# Patient Record
Sex: Female | Born: 1960 | Race: White | Hispanic: No | Marital: Married | Smoking: Never smoker
Health system: Southern US, Community
[De-identification: ages and names within clinical notes are randomized; demographics above are authoritative.]

## PROBLEM LIST (undated history)

## (undated) DIAGNOSIS — N200 Calculus of kidney: Secondary | ICD-10-CM

## (undated) DIAGNOSIS — E785 Hyperlipidemia, unspecified: Secondary | ICD-10-CM

## (undated) HISTORY — PX: COLONOSCOPY: SHX174

## (undated) HISTORY — DX: Calculus of kidney: N20.0

## (undated) HISTORY — DX: Hyperlipidemia, unspecified: E78.5

---

## 1988-09-03 HISTORY — PX: DILATION AND CURETTAGE OF UTERUS: SHX78

## 2005-09-12 ENCOUNTER — Ambulatory Visit: Payer: Self-pay | Admitting: Unknown Physician Specialty

## 2007-11-05 ENCOUNTER — Ambulatory Visit: Payer: Self-pay | Admitting: Unknown Physician Specialty

## 2008-11-09 ENCOUNTER — Ambulatory Visit: Payer: Self-pay | Admitting: Unknown Physician Specialty

## 2009-11-10 ENCOUNTER — Ambulatory Visit: Payer: Self-pay | Admitting: Unknown Physician Specialty

## 2012-03-03 LAB — HM PAP SMEAR: HM PAP: NEGATIVE

## 2014-04-08 ENCOUNTER — Ambulatory Visit: Payer: Self-pay | Admitting: Gastroenterology

## 2017-03-27 ENCOUNTER — Encounter: Payer: Self-pay | Admitting: Obstetrics and Gynecology

## 2017-03-29 ENCOUNTER — Ambulatory Visit (INDEPENDENT_AMBULATORY_CARE_PROVIDER_SITE_OTHER): Payer: BC Managed Care – PPO | Admitting: Obstetrics and Gynecology

## 2017-03-29 ENCOUNTER — Encounter: Payer: Self-pay | Admitting: Obstetrics and Gynecology

## 2017-03-29 VITALS — BP 124/72 | HR 78 | Ht 61.0 in | Wt 151.0 lb

## 2017-03-29 DIAGNOSIS — Z124 Encounter for screening for malignant neoplasm of cervix: Secondary | ICD-10-CM

## 2017-03-29 DIAGNOSIS — Z1231 Encounter for screening mammogram for malignant neoplasm of breast: Secondary | ICD-10-CM

## 2017-03-29 DIAGNOSIS — Z13220 Encounter for screening for lipoid disorders: Secondary | ICD-10-CM | POA: Diagnosis not present

## 2017-03-29 DIAGNOSIS — Z131 Encounter for screening for diabetes mellitus: Secondary | ICD-10-CM

## 2017-03-29 DIAGNOSIS — Z01419 Encounter for gynecological examination (general) (routine) without abnormal findings: Secondary | ICD-10-CM

## 2017-03-29 DIAGNOSIS — Z1329 Encounter for screening for other suspected endocrine disorder: Secondary | ICD-10-CM | POA: Diagnosis not present

## 2017-03-29 DIAGNOSIS — Z1321 Encounter for screening for nutritional disorder: Secondary | ICD-10-CM

## 2017-03-29 DIAGNOSIS — Z1239 Encounter for other screening for malignant neoplasm of breast: Secondary | ICD-10-CM

## 2017-03-29 NOTE — Progress Notes (Signed)
Patient ID: Tami Lopez, female   DOB: [birth year], 56 y.o.   MRN: [id number]     Gynecology Annual Exam  PCP: Patient, No Pcp Per  Chief Complaint:  Chief Complaint  Patient presents with  . Gynecologic Exam    routine blood work  . Bladder Prolapse    no urine leaking    History of Present Illness:Patient is a 56 y.o. [id number] presents for annual exam. The patient has no complaints today.   LMP: No LMP recorded. Patient is postmenopausal. No postmenopausal bleeding.  No significant vasomotor symptoms.  Feels prolapse more pronounced but not bothersome at present to warrant intervention.  The patient is sexually active. She denies dyspareunia.  The patient does perform self breast exams.  There is no notable family history of breast or ovarian cancer in her family.  The patient wears seatbelts: yes.   The patient has regular exercise: not asked.    The patient denies current symptoms of depression.     Review of Systems: Review of Systems  Constitutional: Negative for chills and fever.  HENT: Negative for congestion.   Respiratory: Negative for cough and shortness of breath.   Cardiovascular: Negative for chest pain and palpitations.  Gastrointestinal: Negative for abdominal pain, constipation, diarrhea, heartburn, nausea and vomiting.  Genitourinary: Negative for dysuria, frequency and urgency.  Skin: Negative for itching and rash.  Neurological: Negative for dizziness and headaches.  Endo/Heme/Allergies: Negative for polydipsia.  Psychiatric/Behavioral: Negative for depression.    Past Medical History:  Past Medical History:  Diagnosis Date  . Hyperlipidemia   . Kidney stones     Past Surgical History:  Past Surgical History:  Procedure Laterality Date  . COLONOSCOPY    . DILATION AND CURETTAGE OF UTERUS  1990   SAB    Gynecologic History:  No LMP recorded. Patient is postmenopausal. Last Pap: Results were: 03/03/12 NIL and HR HPV negative  Last mammogram:  09/19/15 Results were: BI-RAD I Obstetric History: [id number]: G4P0013  Family History:  Family History  Problem Relation Age of Onset  . Breast cancer Maternal Grandmother 7350    Social History:  Social History   Social History  . Marital status: Married    Spouse name: N/A  . Number of children: N/A  . Years of education: N/A   Occupational History  . Not on file.   Social History Main Topics  . Smoking status: Never Smoker  . Smokeless tobacco: Never Used  . Alcohol use No  . Drug use: No  . Sexual activity: Yes    Partners: Male    Birth control/ protection: None, Post-menopausal   Other Topics Concern  . Not on file   Social History Narrative  . No narrative on file    Allergies:  No Known Allergies  Medications: Prior to Admission medications   Not on File    Physical Exam Vitals: Blood pressure 124/72, pulse 78, height 5\' 1"  (1.549 m), weight 151 lb (68.5 kg).  General: NAD HEENT: normocephalic, anicteric Thyroid: no enlargement, no palpable nodules Pulmonary: No increased work of breathing, CTAB Cardiovascular: RRR, distal pulses 2+ Breast: Breast symmetrical, no tenderness, no palpable nodules or masses, no skin or nipple retraction present, no nipple discharge.  No axillary or supraclavicular lymphadenopathy. Abdomen: NABS, soft, non-tender, non-distended.  Umbilicus without lesions.  No hepatomegaly, splenomegaly or masses palpable. No evidence of hernia  Genitourinary:  External: Normal external female genitalia.  Normal urethral meatus, normal  Bartholin's and Skene's glands.  Vagina: Normal vaginal mucosa, moderate anterior wall prolapse and uterine prolapse present  Cervix: Grossly normal in appearance, no bleeding  Uterus: Non-enlarged, mobile, normal contour.  No CMT  Adnexa: ovaries non-enlarged, no adnexal masses  Rectal: deferred  Lymphatic: no evidence of inguinal lymphadenopathy Extremities: no edema, erythema, or tenderness Neurologic:  Grossly intact Psychiatric: mood appropriate, affect full  Female chaperone present for pelvic and breast  portions of the physical exam    Assessment: 56 y.o. G4P0013 routine annual  Plan: Problem List Items Addressed This Visit    None    Visit Diagnoses    Thyroid disorder screening    -  Primary   Relevant Orders   CMP14+LP+TP+TSH+CBC/Plt   Screening for malignant neoplasm of cervix       Relevant Orders   PapIG, HPV, rfx 16/18   Breast screening       Relevant Orders   MM DIGITAL SCREENING BILATERAL   Encounter for gynecological examination without abnormal finding       Relevant Orders   PapIG, HPV, rfx 16/18   CMP14+LP+TP+TSH+CBC/Plt   Screening for diabetes mellitus       Relevant Orders   CMP14+LP+TP+TSH+CBC/Plt   Screening cholesterol level       Relevant Orders   CMP14+LP+TP+TSH+CBC/Plt   Encounter for vitamin deficiency screening       Relevant Orders   25-Hydroxyvitamin D Lcms D2+D3      1) Mammogram - recommend yearly screening mammogram.  Mammogram Was ordered today - interested in thermography  2) STI screening was offered and declined  3) ASCCP guidelines and rational discussed.  Patient opts for every 5 years screening interval  4) Osteoporosis  - per USPTF routine screening DEXA at age 54  5) Routine healthcare maintenance including cholesterol, diabetes screening discussed managed by PCP  6) Colonoscopy Screening recommended starting at age 54 for average risk individuals, age 54 for individuals deemed at increased risk (including African Americans) and recommended to continue until age 54.  For patient age 476-85 individualized approach is recommended.  Gold standard screening is via colonoscopy, Cologuard screening is an acceptable alternative for patient unwilling or unable to undergo colonoscopy.  "Colorectal cancer screening for average?risk adults: 2018 guideline update from the American Cancer Society"CA: A Cancer Journal for Clinicians:  Jan 30, 2017  -Up to date  7) Follow up 1 year for routine annual

## 2017-03-29 NOTE — Patient Instructions (Signed)
Preventive Care 40-64 Years, Female Preventive care refers to lifestyle choices and visits with your health care provider that can promote health and wellness. What does preventive care include?  A yearly physical exam. This is also called an annual well check.  Dental exams once or twice a year.  Routine eye exams. Ask your health care provider how often you should have your eyes checked.  Personal lifestyle choices, including: ? Daily care of your teeth and gums. ? Regular physical activity. ? Eating a healthy diet. ? Avoiding tobacco and drug use. ? Limiting alcohol use. ? Practicing safe sex. ? Taking low-dose aspirin daily starting at age 58. ? Taking vitamin and mineral supplements as recommended by your health care provider. What happens during an annual well check? The services and screenings done by your health care provider during your annual well check will depend on your age, overall health, lifestyle risk factors, and family history of disease. Counseling Your health care provider may ask you questions about your:  Alcohol use.  Tobacco use.  Drug use.  Emotional well-being.  Home and relationship well-being.  Sexual activity.  Eating habits.  Work and work Statistician.  Method of birth control.  Menstrual cycle.  Pregnancy history.  Screening You may have the following tests or measurements:  Height, weight, and BMI.  Blood pressure.  Lipid and cholesterol levels. These may be checked every 5 years, or more frequently if you are over 81 years old.  Skin check.  Lung cancer screening. You may have this screening every year starting at age 78 if you have a 30-pack-year history of smoking and currently smoke or have quit within the past 15 years.  Fecal occult blood test (FOBT) of the stool. You may have this test every year starting at age 65.  Flexible sigmoidoscopy or colonoscopy. You may have a sigmoidoscopy every 5 years or a colonoscopy  every 10 years starting at age 30.  Hepatitis C blood test.  Hepatitis B blood test.  Sexually transmitted disease (STD) testing.  Diabetes screening. This is done by checking your blood sugar (glucose) after you have not eaten for a while (fasting). You may have this done every 1-3 years.  Mammogram. This may be done every 1-2 years. Talk to your health care provider about when you should start having regular mammograms. This may depend on whether you have a family history of breast cancer.  BRCA-related cancer screening. This may be done if you have a family history of breast, ovarian, tubal, or peritoneal cancers.  Pelvic exam and Pap test. This may be done every 3 years starting at age 80. Starting at age 36, this may be done every 5 years if you have a Pap test in combination with an HPV test.  Bone density scan. This is done to screen for osteoporosis. You may have this scan if you are at high risk for osteoporosis.  Discuss your test results, treatment options, and if necessary, the need for more tests with your health care provider. Vaccines Your health care provider may recommend certain vaccines, such as:  Influenza vaccine. This is recommended every year.  Tetanus, diphtheria, and acellular pertussis (Tdap, Td) vaccine. You may need a Td booster every 10 years.  Varicella vaccine. You may need this if you have not been vaccinated.  Zoster vaccine. You may need this after age 5.  Measles, mumps, and rubella (MMR) vaccine. You may need at least one dose of MMR if you were born in  1957 or later. You may also need a second dose.  Pneumococcal 13-valent conjugate (PCV13) vaccine. You may need this if you have certain conditions and were not previously vaccinated.  Pneumococcal polysaccharide (PPSV23) vaccine. You may need one or two doses if you smoke cigarettes or if you have certain conditions.  Meningococcal vaccine. You may need this if you have certain  conditions.  Hepatitis A vaccine. You may need this if you have certain conditions or if you travel or work in places where you may be exposed to hepatitis A.  Hepatitis B vaccine. You may need this if you have certain conditions or if you travel or work in places where you may be exposed to hepatitis B.  Haemophilus influenzae type b (Hib) vaccine. You may need this if you have certain conditions.  Talk to your health care provider about which screenings and vaccines you need and how often you need them. This information is not intended to replace advice given to you by your health care provider. Make sure you discuss any questions you have with your health care provider. Document Released: 09/16/2015 Document Revised: 05/09/2016 Document Reviewed: 06/21/2015 Elsevier Interactive Patient Education  2017 [employer].

## 2017-04-02 ENCOUNTER — Encounter: Payer: Self-pay | Admitting: Obstetrics and Gynecology

## 2017-04-02 LAB — PAPIG, HPV, RFX 16/18
HPV, high-risk: NEGATIVE
PAP SMEAR COMMENT: 0

## 2017-04-06 LAB — CMP14+LP+TP+TSH+CBC/PLT
ALBUMIN: 4.6 g/dL (ref 3.5–5.5)
ALK PHOS: 120 IU/L — AB (ref 39–117)
ALT: 13 IU/L (ref 0–32)
AST: 21 IU/L (ref 0–40)
Albumin/Globulin Ratio: 1.6 (ref 1.2–2.2)
BUN / CREAT RATIO: 23 (ref 9–23)
BUN: 18 mg/dL (ref 6–24)
Bilirubin Total: 0.4 mg/dL (ref 0.0–1.2)
CALCIUM: 9.8 mg/dL (ref 8.7–10.2)
CHLORIDE: 101 mmol/L (ref 96–106)
CHOLESTEROL TOTAL: 206 mg/dL — AB (ref 100–199)
CO2: 25 mmol/L (ref 20–29)
CREATININE: 0.8 mg/dL (ref 0.57–1.00)
Free Thyroxine Index: 1.8 (ref 1.2–4.9)
GFR calc non Af Amer: 84 mL/min/{1.73_m2} (ref >59)
GFR, EST AFRICAN AMERICAN: 97 mL/min/{1.73_m2} (ref >59)
GLOBULIN, TOTAL: 2.8 g/dL (ref 1.5–4.5)
GLUCOSE: 85 mg/dL (ref 65–99)
HDL: 53 mg/dL (ref >39)
HEMATOCRIT: 42.7 % (ref 34.0–46.6)
Hemoglobin: 14.3 g/dL (ref 11.1–15.9)
LDL CALC: 119 mg/dL — AB (ref 0–99)
LDl/HDL Ratio: 2.2 ratio (ref 0.0–3.2)
MCH: 29.8 pg (ref 26.6–33.0)
MCHC: 33.5 g/dL (ref 31.5–35.7)
MCV: 89 fL (ref 79–97)
PLATELETS: 237 10*3/uL (ref 150–379)
Potassium: 4.6 mmol/L (ref 3.5–5.2)
RBC: 4.8 x10E6/uL (ref 3.77–5.28)
RDW: 14 % (ref 12.3–15.4)
SODIUM: 140 mmol/L (ref 134–144)
T3 Uptake Ratio: 22 % — ABNORMAL LOW (ref 24–39)
T4 TOTAL: 8.1 ug/dL (ref 4.5–12.0)
TOTAL PROTEIN: 7.4 g/dL (ref 6.0–8.5)
TRIGLYCERIDES: 170 mg/dL — AB (ref 0–149)
TSH: 2.12 u[IU]/mL (ref 0.450–4.500)
VLDL CHOLESTEROL CAL: 34 mg/dL (ref 5–40)
WBC: 4.7 10*3/uL (ref 3.4–10.8)

## 2017-04-06 LAB — 25-HYDROXY VITAMIN D LCMS D2+D3
25-Hydroxy, Vitamin D-2: <1 ng/mL
25-Hydroxy, Vitamin D-3: 34 ng/mL
25-Hydroxy, Vitamin D: 34 ng/mL

## 2017-04-11 ENCOUNTER — Encounter: Payer: Self-pay | Admitting: Obstetrics and Gynecology

## 2019-11-05 ENCOUNTER — Encounter: Payer: Self-pay | Admitting: Gastroenterology

## 2020-01-08 ENCOUNTER — Ambulatory Visit: Payer: Self-pay | Attending: Internal Medicine

## 2020-01-08 DIAGNOSIS — Z23 Encounter for immunization: Secondary | ICD-10-CM

## 2020-01-08 NOTE — Progress Notes (Signed)
   Covid-19 Vaccination Clinic  Name:  TAYSHA MAJEWSKI    MRN: [id number] DOB: October 03, 1960  01/08/2020  Ms. Troutman was observed post Covid-19 immunization for 15 minutes without incident. She was provided with Vaccine Information Sheet and instruction to access the V-Safe system.   Ms. Derstine was instructed to call 911 with any severe reactions post vaccine: Marland Kitchen Difficulty breathing  . Swelling of face and throat  . A fast heartbeat  . A bad rash all over body  . Dizziness and weakness   Immunizations Administered    Name Date Dose VIS Date Route   Pfizer COVID-19 Vaccine 01/08/2020  8:51 AM 0.3 mL 10/28/2018 Intramuscular   Manufacturer: [employer], [employer]   Lot: [id number]   NDC: [address]-5

## 2020-02-02 ENCOUNTER — Ambulatory Visit: Payer: Self-pay | Attending: Internal Medicine

## 2020-02-02 DIAGNOSIS — Z23 Encounter for immunization: Secondary | ICD-10-CM

## 2020-02-02 NOTE — Progress Notes (Signed)
° °  Covid-19 Vaccination Clinic  Name:  Tami Lopez    MRN: [id number] DOB: [birth year]  02/02/2020  [name] was observed post Covid-19 immunization for 15 minutes without incident. She was provided with Vaccine Information Sheet and instruction to access the V-Safe system.   [name] was instructed to call 911 with any severe reactions post vaccine:  Difficulty breathing   Swelling of face and throat   A fast heartbeat   A bad rash all over body   Dizziness and weakness   Immunizations Administered    Name Date Dose VIS Date Route   Pfizer COVID-19 Vaccine 02/02/2020  8:23 AM 0.3 mL 10/28/2018 Intramuscular   Manufacturer: [employer], [employer]   Lot: [id number]   NDC: [address]-8

## 2020-03-16 ENCOUNTER — Other Ambulatory Visit: Payer: Self-pay | Admitting: Family Medicine

## 2020-03-16 DIAGNOSIS — Z1231 Encounter for screening mammogram for malignant neoplasm of breast: Secondary | ICD-10-CM

## 2020-04-07 ENCOUNTER — Ambulatory Visit
Admission: RE | Admit: 2020-04-07 | Discharge: 2020-04-07 | Disposition: A | Discharge status: Home / Self Care | Payer: BC Managed Care – PPO | Source: Ambulatory Visit | Attending: Family Medicine | Admitting: Family Medicine

## 2020-04-07 ENCOUNTER — Other Ambulatory Visit: Payer: Self-pay

## 2020-04-07 DIAGNOSIS — Z1231 Encounter for screening mammogram for malignant neoplasm of breast: Secondary | ICD-10-CM | POA: Diagnosis not present

## 2020-06-10 ENCOUNTER — Other Ambulatory Visit (HOSPITAL_COMMUNITY)
Admission: RE | Admit: 2020-06-10 | Discharge: 2020-06-10 | Disposition: A | Discharge status: Home / Self Care | Payer: BC Managed Care – PPO | Source: Ambulatory Visit | Attending: Obstetrics and Gynecology | Admitting: Obstetrics and Gynecology

## 2020-06-10 ENCOUNTER — Ambulatory Visit (INDEPENDENT_AMBULATORY_CARE_PROVIDER_SITE_OTHER): Payer: BC Managed Care – PPO | Admitting: Obstetrics and Gynecology

## 2020-06-10 ENCOUNTER — Other Ambulatory Visit: Payer: Self-pay

## 2020-06-10 ENCOUNTER — Encounter: Payer: Self-pay | Admitting: Obstetrics and Gynecology

## 2020-06-10 VITALS — BP 122/88 | HR 76 | Ht 61.0 in | Wt 163.0 lb

## 2020-06-10 DIAGNOSIS — Z01419 Encounter for gynecological examination (general) (routine) without abnormal findings: Secondary | ICD-10-CM

## 2020-06-10 DIAGNOSIS — Z1239 Encounter for other screening for malignant neoplasm of breast: Secondary | ICD-10-CM

## 2020-06-10 DIAGNOSIS — Z124 Encounter for screening for malignant neoplasm of cervix: Secondary | ICD-10-CM

## 2020-06-10 DIAGNOSIS — N814 Uterovaginal prolapse, unspecified: Secondary | ICD-10-CM

## 2020-06-10 NOTE — Progress Notes (Signed)
Gynecology Annual Exam  PCP: Marisue Ivan, MD  Chief Complaint:  Chief Complaint  Patient presents with  . Gynecologic Exam    prolapse bladder    History of Present Illness:Patient is a 59 y.o. [id number] presents for annual exam. The patient has no complaints today.   LMP: No LMP recorded. Patient is postmenopausal.  The patient is sexually active. She denies dyspareunia.  The patient does perform self breast exams.  There is no notable family history of breast or ovarian cancer in her family.  The patient wears seatbelts: yes.   The patient has regular exercise: not asked.    The patient denies current symptoms of depression.     Review of Systems: Review of Systems  Constitutional: Negative for chills and fever.  HENT: Negative for congestion.   Respiratory: Negative for cough and shortness of breath.   Cardiovascular: Negative for chest pain and palpitations.  Gastrointestinal: Negative for abdominal pain, constipation, diarrhea, heartburn, nausea and vomiting.  Genitourinary: Negative for dysuria, frequency and urgency.  Skin: Negative for itching and rash.  Neurological: Negative for dizziness and headaches.  Endo/Heme/Allergies: Negative for polydipsia.  Psychiatric/Behavioral: Negative for depression.    Past Medical History:  There are no problems to display for this patient.   Past Surgical History:  Past Surgical History:  Procedure Laterality Date  . COLONOSCOPY    . DILATION AND CURETTAGE OF UTERUS  1990   SAB    Gynecologic History:  No LMP recorded. Patient is postmenopausal. Last Pap: Results were: 03/29/2017 NIL and HR HPV negative  Last mammogram: 04/07/2020 Results were: BI-RAD I  Obstetric History: [id number]  Family History:  Family History  Problem Relation Age of Onset  . Breast cancer Maternal Grandmother 3    Social History:  Social History   Socioeconomic History  . Marital status: Married    Spouse name: Not on file  .  Number of children: Not on file  . Years of education: Not on file  . Highest education level: Not on file  Occupational History  . Not on file  Tobacco Use  . Smoking status: Never Smoker  . Smokeless tobacco: Never Used  Vaping Use  . Vaping Use: Never used  Substance and Sexual Activity  . Alcohol use: No  . Drug use: No  . Sexual activity: Yes    Partners: Male    Birth control/protection: None, Post-menopausal  Other Topics Concern  . Not on file  Social History Narrative  . Not on file   Social Determinants of Health   Financial Resource Strain:   . Difficulty of Paying Living Expenses: Not on file  Food Insecurity:   . Worried About Programme researcher, broadcasting/film/video in the Last Year: Not on file  . Ran Out of Food in the Last Year: Not on file  Transportation Needs:   . Lack of Transportation (Medical): Not on file  . Lack of Transportation (Non-Medical): Not on file  Physical Activity:   . Days of Exercise per Week: Not on file  . Minutes of Exercise per Session: Not on file  Stress:   . Feeling of Stress : Not on file  Social Connections:   . Frequency of Communication with Friends and Family: Not on file  . Frequency of Social Gatherings with Friends and Family: Not on file  . Attends Religious Services: Not on file  . Active Member of Clubs or Organizations: Not on file  . Attends Banker  Meetings: Not on file  . Marital Status: Not on file  Intimate Partner Violence:   . Fear of Current or Ex-Partner: Not on file  . Emotionally Abused: Not on file  . Physically Abused: Not on file  . Sexually Abused: Not on file    Allergies:  No Known Allergies  Medications: Prior to Admission medications   Not on File    Physical Exam Vitals: Blood pressure 122/88, pulse 76, height 5\' 1"  (1.549 m), weight 163 lb (73.9 kg).   General: NAD HEENT: normocephalic, anicteric Thyroid: no enlargement, no palpable nodules Pulmonary: No increased work of breathing,  CTAB Cardiovascular: RRR, distal pulses 2+ Abdomen: NABS, soft, non-tender, non-distended.  Umbilicus without lesions.  No hepatomegaly, splenomegaly or masses palpable. No evidence of hernia  Genitourinary:  External: Normal external female genitalia.  Normal urethral meatus, normal Bartholin's and Skene's glands.    Vagina: Normal vaginal mucosa, there is a grade II cystocele that come down to the level of the introitus with straining, as well as some apical prolapse.  Good posterior wall support.   Cervix: Grossly normal in appearance, no bleeding  Uterus: Non-enlarged, mobile, normal contour.  No CMT  Adnexa: ovaries non-enlarged, no adnexal masses  Rectal: deferred  Lymphatic: no evidence of inguinal lymphadenopathy Extremities: no edema, erythema, or tenderness Neurologic: Grossly intact Psychiatric: mood appropriate, affect full  Female chaperone present for pelvic and breast  portions of the physical exam  Immunization History  Administered Date(s) Administered  . PFIZER SARS-COV-2 Vaccination 01/08/2020, 02/02/2020    Assessment: 59 y.o. G4P0013 routine annual exam  Plan: Problem List Items Addressed This Visit    None    Visit Diagnoses    Encounter for gynecological examination without abnormal finding    -  Primary   Screening for malignant neoplasm of cervix       Relevant Orders   Cytology - PAP   Breast screening          1) Mammogram - recommend yearly screening mammogram.  Mammogram Is up to date  2) STI screening  was notoffered and therefore not obtained  3) ASCCP guidelines and rational discussed.  Patient opts for every 3 years screening interval.  May be interested to spacing out to 5 year next year  4) Osteoporosis  - per USPTF routine screening DEXA at age 24  5) Routine healthcare maintenance including cholesterol, diabetes screening discussed managed by PCP  6) Colonoscopy colonoscopy 2015  7) Return in about 1 year (around 06/10/2021) for  annual.    08/10/2021, MD Vena Austria, [facility] 06/10/2020, 10:16 AM

## 2020-06-13 LAB — CYTOLOGY - PAP
Comment: NEGATIVE
Diagnosis: NEGATIVE
High risk HPV: NEGATIVE

## 2020-09-17 ENCOUNTER — Other Ambulatory Visit: Payer: Self-pay

## 2020-09-17 DIAGNOSIS — Z20822 Contact with and (suspected) exposure to covid-19: Secondary | ICD-10-CM

## 2020-09-20 LAB — NOVEL CORONAVIRUS, NAA: SARS-CoV-2, NAA: DETECTED — AB

## 2021-06-02 ENCOUNTER — Telehealth: Payer: Self-pay | Admitting: Obstetrics and Gynecology

## 2021-06-02 NOTE — Telephone Encounter (Signed)
Contacted patient to reschedule the appointment on 10/12 with AMS.  Left a message.

## 2021-06-14 ENCOUNTER — Ambulatory Visit: Payer: BC Managed Care – PPO | Admitting: Obstetrics and Gynecology

## 2021-06-23 ENCOUNTER — Ambulatory Visit (INDEPENDENT_AMBULATORY_CARE_PROVIDER_SITE_OTHER): Payer: BC Managed Care – PPO | Admitting: Obstetrics and Gynecology

## 2021-06-23 ENCOUNTER — Encounter: Payer: Self-pay | Admitting: Obstetrics and Gynecology

## 2021-06-23 ENCOUNTER — Other Ambulatory Visit: Payer: Self-pay

## 2021-06-23 VITALS — BP 100/68 | HR 73 | Ht 61.0 in | Wt 162.0 lb

## 2021-06-23 DIAGNOSIS — Z1239 Encounter for other screening for malignant neoplasm of breast: Secondary | ICD-10-CM

## 2021-06-23 DIAGNOSIS — Z01419 Encounter for gynecological examination (general) (routine) without abnormal findings: Secondary | ICD-10-CM

## 2021-06-23 NOTE — Patient Instructions (Signed)
[facility] [address] [place] [address]  [facility] [place]  [address] [place] [address]  Phone: [phone]  

## 2021-06-23 NOTE — Progress Notes (Signed)
Gynecology Annual Exam  PCP: Marisue Ivan, MD  Chief Complaint:  Chief Complaint  Patient presents with   Gynecologic Exam    Annual -no concerns. RM 5    History of Present Illness:Patient is a 59 y.o. [id number] presents for annual exam. The patient has no complaints today.   LMP: No LMP recorded. Patient is postmenopausal. No PMB  The patient is sexually active. She denies dyspareunia.  The patient does perform self breast exams.  There is no notable family history of breast or ovarian cancer in her family.  The patient wears seatbelts: yes.   The patient has regular exercise: not asked.    The patient denies current symptoms of depression.     Review of Systems: Review of Systems  Constitutional:  Negative for chills and fever.  HENT:  Negative for congestion.   Respiratory:  Negative for cough and shortness of breath.   Cardiovascular:  Negative for chest pain and palpitations.  Gastrointestinal:  Negative for abdominal pain, constipation, diarrhea, heartburn, nausea and vomiting.  Genitourinary:  Negative for dysuria, frequency and urgency.  Skin:  Negative for itching and rash.  Neurological:  Negative for dizziness and headaches.  Endo/Heme/Allergies:  Negative for polydipsia.  Psychiatric/Behavioral:  Negative for depression.    Past Medical History:  Patient Active Problem List   Diagnosis Date Noted   Cystocele with uterine descensus 06/10/2020    Past Surgical History:  Past Surgical History:  Procedure Laterality Date   COLONOSCOPY     DILATION AND CURETTAGE OF UTERUS  1990   SAB    Gynecologic History:  No LMP recorded. Patient is postmenopausal. Last Pap: Results were: 10/08/2021NIL and HR HPV negative  Last mammogram: 04/07/2020 Results were: BI-RAD I  Obstetric History: E3P2951  Family History:  Family History  Problem Relation Age of Onset   Breast cancer Maternal Grandmother 13    Social History:  Social History    Socioeconomic History   Marital status: Married    Spouse name: Not on file   Number of children: Not on file   Years of education: Not on file   Highest education level: Not on file  Occupational History   Not on file  Tobacco Use   Smoking status: Never   Smokeless tobacco: Never  Vaping Use   Vaping Use: Never used  Substance and Sexual Activity   Alcohol use: No   Drug use: No   Sexual activity: Yes    Partners: Male    Birth control/protection: None, Post-menopausal  Other Topics Concern   Not on file  Social History Narrative   Not on file   Social Determinants of Health   Financial Resource Strain: Not on file  Food Insecurity: Not on file  Transportation Needs: Not on file  Physical Activity: Not on file  Stress: Not on file  Social Connections: Not on file  Intimate Partner Violence: Not on file    Allergies:  No Known Allergies  Medications: Prior to Admission medications   Not on File    Physical Exam Vitals: Blood pressure 100/68, pulse 73, height 5\' 1"  (1.549 m), weight 162 lb (73.5 kg). Body mass index is 30.61 kg/m.  General: NAD HEENT: normocephalic, anicteric Thyroid: no enlargement, no palpable nodules Pulmonary: No increased work of breathing, CTAB Cardiovascular: RRR, distal pulses 2+ Breast: Breast symmetrical, no tenderness, no palpable nodules or masses, no skin or nipple retraction present, no nipple discharge.  No axillary or supraclavicular lymphadenopathy. Abdomen:  NABS, soft, non-tender, non-distended.  Umbilicus without lesions.  No hepatomegaly, splenomegaly or masses palpable. No evidence of hernia  Genitourinary:  External: Normal external female genitalia.  Normal urethral meatus, normal Bartholin's and Skene's glands.    Vagina: Normal vaginal mucosa, cystocele present  Cervix: Grossly normal in appearance, no bleeding  Uterus: Non-enlarged, mobile, normal contour.  No CMT  Adnexa: ovaries non-enlarged, no adnexal  masses  Rectal: deferred  Lymphatic: no evidence of inguinal lymphadenopathy Extremities: no edema, erythema, or tenderness Neurologic: Grossly intact Psychiatric: mood appropriate, affect full  Female chaperone present for pelvic and breast  portions of the physical exam     Assessment: 60 y.o. G4P0013 routine annual exam  Plan: Problem List Items Addressed This Visit   None Visit Diagnoses     Encounter for gynecological examination without abnormal finding    -  Primary   Breast screening       Relevant Orders   MM 3D SCREEN BREAST BILATERAL       1) Mammogram - recommend yearly screening mammogram.  Mammogram Was ordered today  2) STI screening  was notoffered and therefore not obtained  3) ASCCP guidelines and rational discussed.  Patient opts for every 3 years screening interval  4) Osteoporosis  - per USPTF routine screening DEXA at age 50  5) Routine healthcare maintenance including cholesterol, diabetes screening discussed managed by PCP  6) Colonoscopy - UTD 2015  7) Return in about 1 year (around 06/23/2022) for annual.    Vena Austria, MD Domingo Pulse, [facility] 06/23/2021, 9:41 AM

## 2021-07-07 ENCOUNTER — Ambulatory Visit
Admission: RE | Admit: 2021-07-07 | Discharge: 2021-07-07 | Disposition: A | Discharge status: Home / Self Care | Payer: BC Managed Care – PPO | Source: Ambulatory Visit | Attending: Obstetrics and Gynecology | Admitting: Obstetrics and Gynecology

## 2021-07-07 ENCOUNTER — Other Ambulatory Visit: Payer: Self-pay

## 2021-07-07 DIAGNOSIS — Z1231 Encounter for screening mammogram for malignant neoplasm of breast: Secondary | ICD-10-CM | POA: Diagnosis present

## 2021-07-07 DIAGNOSIS — Z1239 Encounter for other screening for malignant neoplasm of breast: Secondary | ICD-10-CM

## 2021-08-13 IMAGING — MG DIGITAL SCREENING BILAT W/ TOMO W/ CAD
8 series · 8 of 24 positions shown · non-contrast
Comparison: Previous exam(s).

CLINICAL DATA: Screening.

EXAM:
DIGITAL SCREENING BILATERAL MAMMOGRAM WITH TOMO AND CAD

[L CC synth-2D]
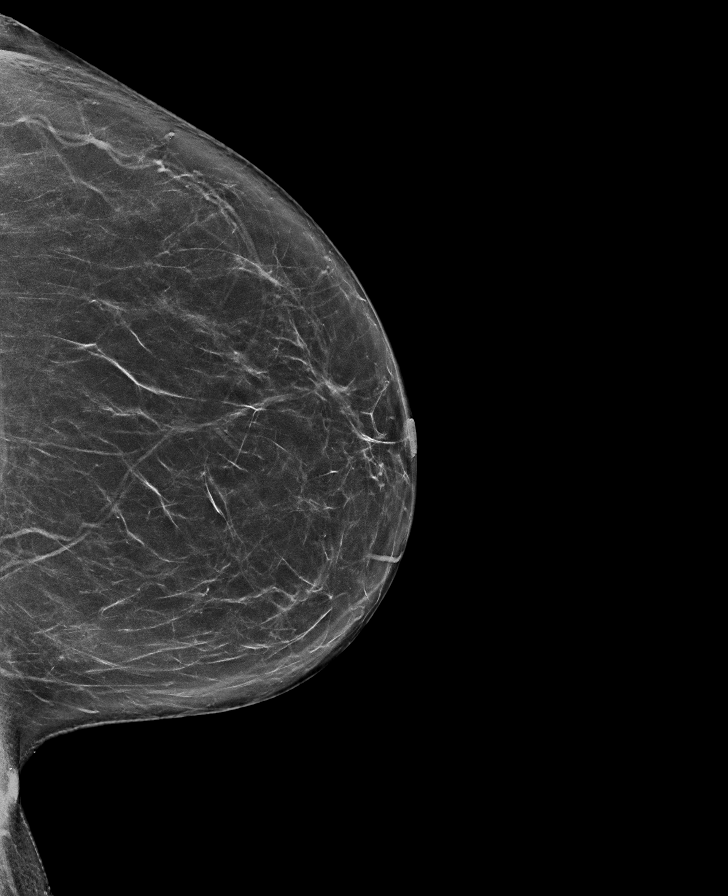

[R MLO synth-2D]
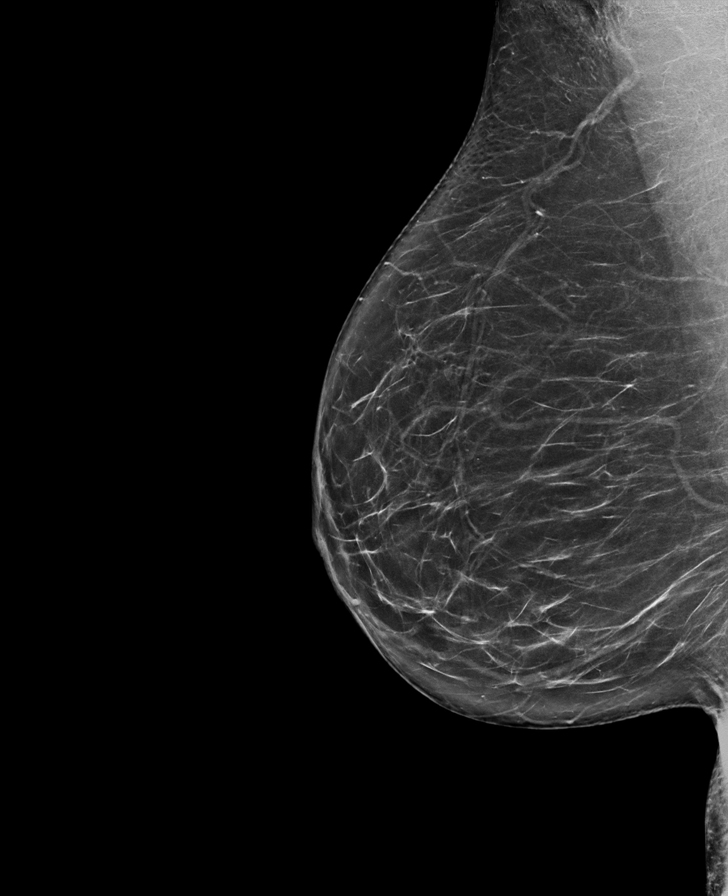

[R CC synth-2D]
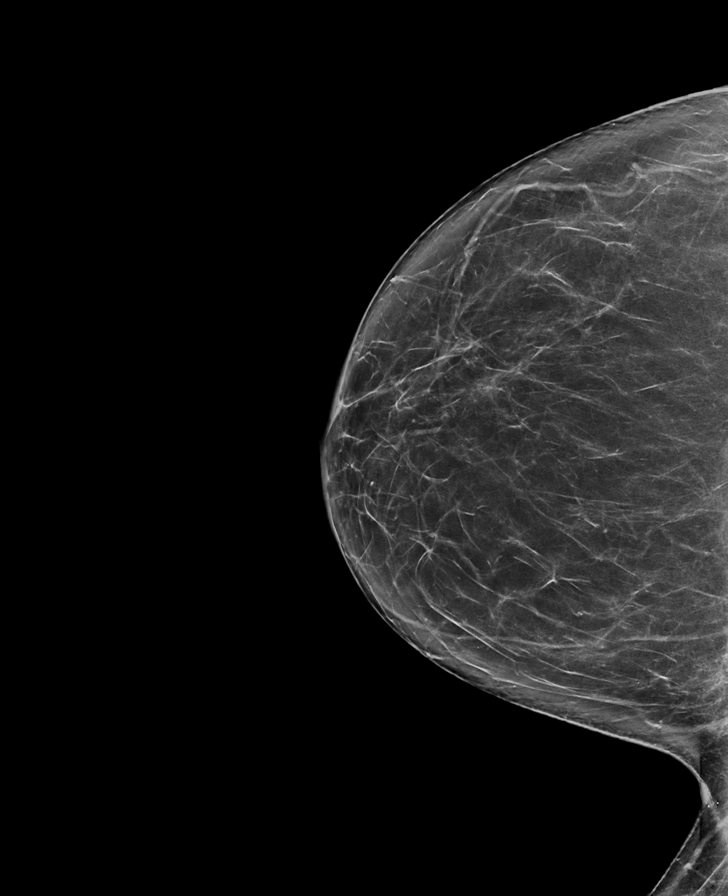

[L MLO synth-2D]
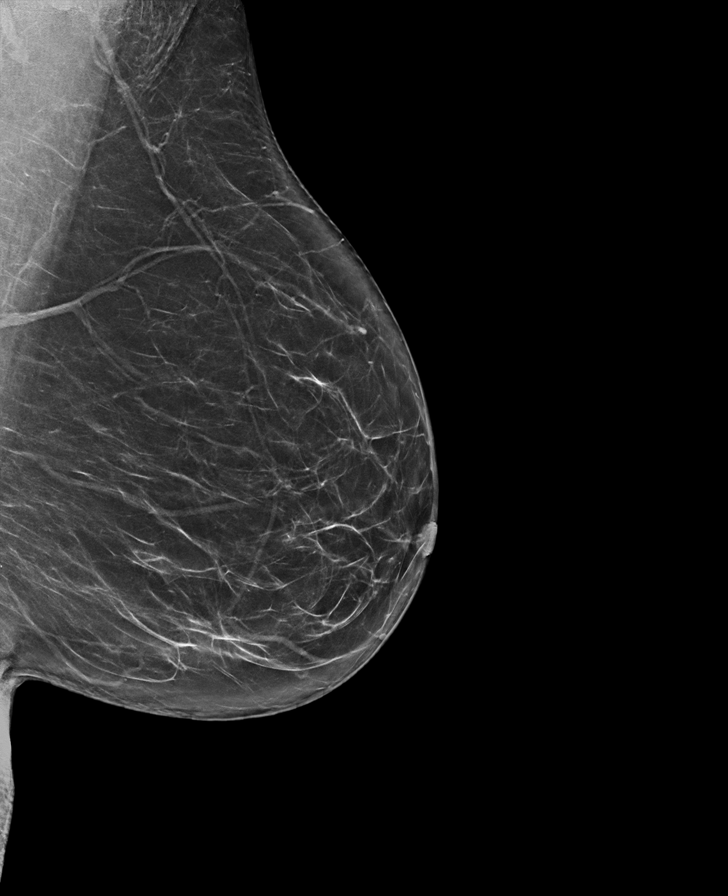

[R CC tomo · tomo slice 39/77.0]
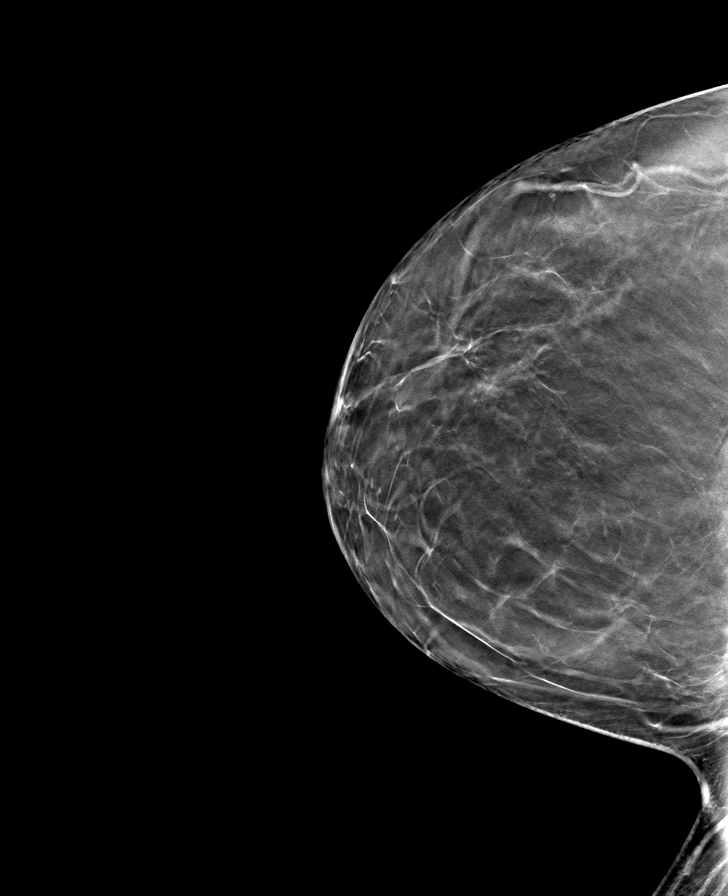

[R MLO tomo · tomo slice 40/79.0]
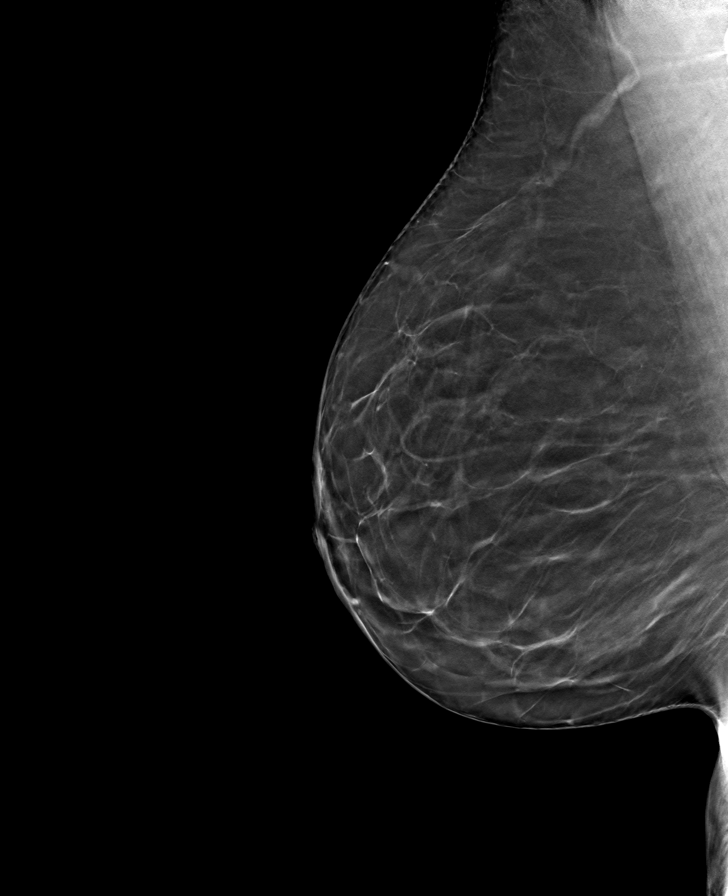

[L CC tomo · tomo slice 38/75.0]
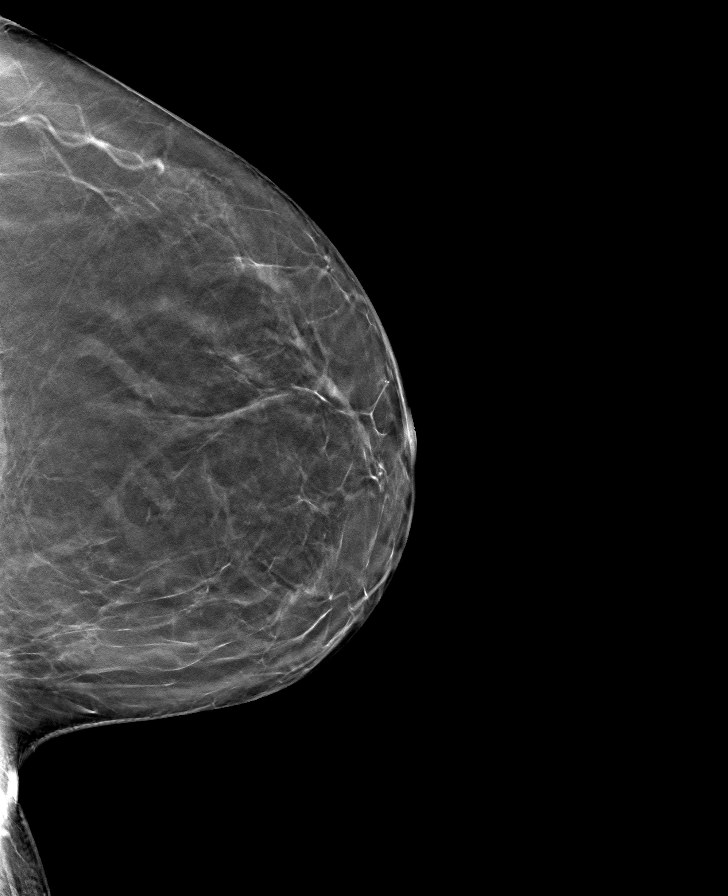

[L MLO tomo · tomo slice 40/79.0]
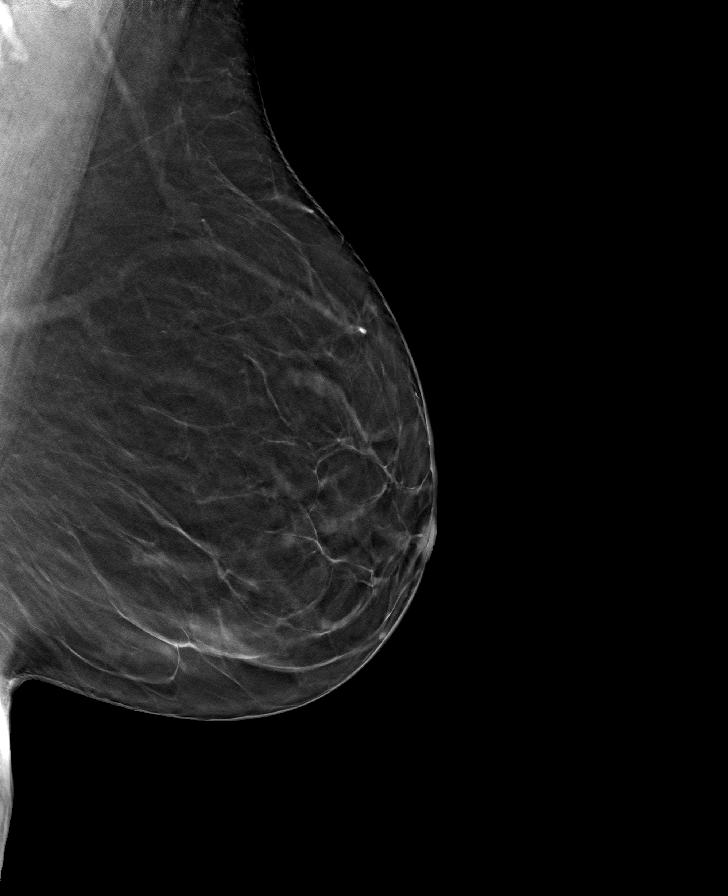

[8 of 24 positions shown; findings below may reference images not displayed]

ACR Breast Density Category b: There are scattered areas of
fibroglandular density.
FINDINGS: There are no findings suspicious for malignancy. Images were
processed with CAD.
IMPRESSION: No mammographic evidence of malignancy. A result letter of this
screening mammogram will be mailed directly to the patient.

RECOMMENDATION:
Screening mammogram in one year. (Code:[id number])

BI-RADS CATEGORY  1: Negative.

## 2022-01-24 ENCOUNTER — Ambulatory Visit (INDEPENDENT_AMBULATORY_CARE_PROVIDER_SITE_OTHER): Payer: BC Managed Care – PPO | Admitting: Obstetrics and Gynecology

## 2022-01-24 ENCOUNTER — Encounter: Payer: Self-pay | Admitting: Obstetrics and Gynecology

## 2022-01-24 VITALS — BP 104/68 | HR 69 | Ht 61.0 in | Wt 134.5 lb

## 2022-01-24 DIAGNOSIS — R102 Pelvic and perineal pain: Secondary | ICD-10-CM

## 2022-01-24 DIAGNOSIS — N8189 Other female genital prolapse: Secondary | ICD-10-CM

## 2022-01-24 DIAGNOSIS — N8110 Cystocele, unspecified: Secondary | ICD-10-CM | POA: Diagnosis not present

## 2022-01-24 DIAGNOSIS — Z7689 Persons encountering health services in other specified circumstances: Secondary | ICD-10-CM | POA: Diagnosis not present

## 2022-01-24 NOTE — Progress Notes (Signed)
HPI:      Tami Lopez is a 61 y.o. [phone] who LMP was No LMP recorded. Patient is postmenopausal.  Subjective:   She presents today saying that she knows she has some degree of pelvic prolapse she has previously been diagnosed with cystocele by Dr. Bonney Aid.  She states that it was manageable but has become a little bit worse recently.  She is doing pelvic floor therapy which is helped a great deal.  She states that now she can feel something at the opening of her vagina causing pressure symptoms regularly.  She says she is not interested in pessary or surgery.  She denies significant urine loss.  She simply would like a second opinion regarding her pelvic relaxation.    Hx: The following portions of the patient's history were reviewed and updated as appropriate:             She  has a past medical history of Hyperlipidemia and Kidney stones. She does not have any pertinent problems on file. She  has a past surgical history that includes Colonoscopy and Dilation and curettage of uterus (1990). Her family history includes Breast cancer (age of onset: 51) in her maternal grandmother. She  reports that she has never smoked. She has never used smokeless tobacco. She reports that she does not drink alcohol and does not use drugs. She currently has no medications in their medication list. She has No Known Allergies.       Review of Systems:  Review of Systems  Constitutional: Denied constitutional symptoms, night sweats, recent illness, fatigue, fever, insomnia and weight loss.  Eyes: Denied eye symptoms, eye pain, photophobia, vision change and visual disturbance.  Ears/Nose/Throat/Neck: Denied ear, nose, throat or neck symptoms, hearing loss, nasal discharge, sinus congestion and sore throat.  Cardiovascular: Denied cardiovascular symptoms, arrhythmia, chest pain/pressure, edema, exercise intolerance, orthopnea and palpitations.  Respiratory: Denied pulmonary symptoms, asthma, pleuritic  pain, productive sputum, cough, dyspnea and wheezing.  Gastrointestinal: Denied, gastro-esophageal reflux, melena, nausea and vomiting.  Genitourinary: See HPI for additional information.  Musculoskeletal: Denied musculoskeletal symptoms, stiffness, swelling, muscle weakness and myalgia.  Dermatologic: Denied dermatology symptoms, rash and scar.  Neurologic: Denied neurology symptoms, dizziness, headache, neck pain and syncope.  Psychiatric: Denied psychiatric symptoms, anxiety and depression.  Endocrine: Denied endocrine symptoms including hot flashes and night sweats.   Meds:   No current outpatient medications on file prior to visit.   No current facility-administered medications on file prior to visit.      Objective:     There were no vitals filed for this visit. There were no vitals filed for this visit.            Physical examination   Pelvic:   Vulva: Normal appearance.  No lesions.  Vagina: No lesions or abnormalities noted.  Support: Fourth degree cystocele, third-degree uterine prolapse second-degree rectocele.  Urethra No masses tenderness or scarring.  Meatus Normal size without lesions or prolapse.  Cervix: Normal appearance.  No lesions.  Anus: Normal exam.  No lesions.  Perineum: Normal exam.  No lesions.        Bimanual   Uterus: Normal size.  Non-tender.  Mobile.  AV.  Adnexae: No masses.  Non-tender to palpation.  Cul-de-sac: Negative for abnormality.             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 Establishing care with new doctor, encounter for   2. Female bladder prolapse   3. Pelvic pressure in female   4. Pelvic relaxation disorder     Patient currently doing pelvic floor therapy and is comfortable with her current condition although she is sometimes symptomatic with prolapse.   Plan:            1.  Multiple options discussed including  continued pelvic floor therapy and expectant management, surgical options, and pessary use.  Patient will inform us when she has made a decision or has changed her mind and would like more definitive therapy. All questions answered. Orders No orders of the defined types were placed in this encounter.   No orders of the defined types were placed in this encounter.     F/U  No follow-ups on file. I spent 46 minutes involved in the care of this patient preparing to see the patient by obtaining and reviewing her medical history (including labs, imaging tests and prior procedures), documenting clinical information in the electronic health record (EHR), counseling and coordinating care plans, writing and sending prescriptions, ordering tests or procedures and in direct communicating with the patient and medical staff discussing pertinent items from her history and physical exam.  Elonda Husky, M.D. 01/24/2022 10:23 AM

## 2022-01-24 NOTE — Progress Notes (Signed)
Patient presents today for bladder prolapse. She states since 2014 she has dealt with pressure and discomfort and chose to do nothing at that time. Patient has been doing pelvic floor physical therapy and states it is working well. She would not like a pessary or surgery at this time. Patient states no other questions or concerns at this time.

## 2022-11-27 ENCOUNTER — Ambulatory Visit (INDEPENDENT_AMBULATORY_CARE_PROVIDER_SITE_OTHER): Payer: BC Managed Care – PPO | Admitting: Advanced Practice Midwife

## 2022-11-27 ENCOUNTER — Encounter: Payer: Self-pay | Admitting: Advanced Practice Midwife

## 2022-11-27 ENCOUNTER — Other Ambulatory Visit: Payer: Self-pay | Admitting: Family Medicine

## 2022-11-27 VITALS — BP 120/80 | Ht 61.0 in | Wt 141.0 lb

## 2022-11-27 DIAGNOSIS — N8110 Cystocele, unspecified: Secondary | ICD-10-CM | POA: Diagnosis not present

## 2022-11-27 DIAGNOSIS — Z1231 Encounter for screening mammogram for malignant neoplasm of breast: Secondary | ICD-10-CM

## 2022-11-27 DIAGNOSIS — Z01419 Encounter for gynecological examination (general) (routine) without abnormal findings: Secondary | ICD-10-CM

## 2022-11-29 ENCOUNTER — Encounter: Payer: Self-pay | Admitting: Advanced Practice Midwife

## 2022-11-29 NOTE — Progress Notes (Signed)
Patient ID: Tami Lopez, female   DOB: [birth year], 62 y.o.   MRN: [id number]  Reason for Visit: Vaginal Prolapse   Subjective:  Date of Service: 11/27/2022  HPI:  Tami Lopez is a 62 y.o. G4 P37 postmenopausal female being seen for evaluation and to discuss management of her bladder prolapse. She was seen by Dr Amalia Hailey last May regarding the same. At that time she was not interested in pessary or surgery. She reports now that her symptoms of bulging and pressure are worsening and she is considering surgery. Her prolapse does not interfere with bowel or bladder function or with sexual activity. She denies any pain. She does not want a pessary. She does admit that she was seen for pelvic floor therapy, which she did not follow through with. After discussion, she is now leaning towards trying pelvic floor therapy again. She understands that therapy would take some time and effort and still may not be the answer. She is advised to follow up as needed with Dr Amalia Hailey if she does decide to have surgery.   She was seen within the past year by PCP for annual wellness exam.  Past Medical History:  Diagnosis Date   Hyperlipidemia    Kidney stones    Family History  Problem Relation Age of Onset   Breast cancer Maternal Grandmother 52   Past Surgical History:  Procedure Laterality Date   COLONOSCOPY     DILATION AND CURETTAGE OF UTERUS  1990   SAB    Short Social History:  Social History   Tobacco Use   Smoking status: Never   Smokeless tobacco: Never  Substance Use Topics   Alcohol use: No    No Known Allergies  No current outpatient medications on file.   No current facility-administered medications for this visit.    Review of Systems  Constitutional:  Negative for chills and fever.  HENT:  Negative for congestion, ear discharge, ear pain, hearing loss, sinus pain and sore throat.   Eyes:  Negative for blurred vision and double vision.  Respiratory:  Negative for  cough, shortness of breath and wheezing.   Cardiovascular:  Negative for chest pain, palpitations and leg swelling.  Gastrointestinal:  Negative for abdominal pain, blood in stool, constipation, diarrhea, heartburn, melena, nausea and vomiting.  Genitourinary:  Negative for dysuria, flank pain, frequency, hematuria and urgency.       Positive for bladder prolapse  Musculoskeletal:  Negative for back pain, joint pain and myalgias.  Skin:  Negative for itching and rash.  Neurological:  Positive for tingling. Negative for dizziness, tremors, sensory change, speech change, focal weakness, seizures, loss of consciousness, weakness and headaches.       Positive for numbness  Endo/Heme/Allergies:  Negative for environmental allergies. Does not bruise/bleed easily.  Psychiatric/Behavioral:  Negative for depression, hallucinations, memory loss, substance abuse and suicidal ideas. The patient is not nervous/anxious and does not have insomnia.        Objective:  Objective   Vitals:   11/27/22 1535  BP: 120/80  Weight: 141 lb (64 kg)  Height: 5\' 1"  (1.549 m)   Body mass index is 26.64 kg/m. Constitutional: Well nourished, well developed female in no acute distress.  HEENT: normal Skin: Warm and dry.  Cardiovascular: Regular rate and rhythm.   Respiratory:  Normal respiratory effort Psych: Alert and Oriented x3. No memory deficits. Normal mood and affect.    Pelvic exam:  is not limited by body habitus EGBUS: within  normal limits Vagina: bladder prolapse visible at introitus, unable to engage muscles to "bear down" or squeeze.   Assessment/Plan:     62 y.o. G4 P3013  Follow up with Pelvic Floor therapist Follow up with Dr Amalia Hailey as desired to discuss surgery   [facility] 11/29/2022, 7:38 PM

## 2022-12-19 ENCOUNTER — Ambulatory Visit
Admission: RE | Admit: 2022-12-19 | Discharge: 2022-12-19 | Disposition: A | Discharge status: Home / Self Care | Payer: BC Managed Care – PPO | Source: Ambulatory Visit | Attending: Family Medicine | Admitting: Family Medicine

## 2022-12-19 DIAGNOSIS — Z1231 Encounter for screening mammogram for malignant neoplasm of breast: Secondary | ICD-10-CM | POA: Insufficient documentation
